# Patient Record
Sex: Female | Born: 1950 | Race: White | Hispanic: No | Marital: Married | Smoking: Never smoker
Health system: Southern US, Community
[De-identification: ages and names within clinical notes are randomized; demographics above are authoritative.]

## PROBLEM LIST (undated history)

## (undated) DIAGNOSIS — E119 Type 2 diabetes mellitus without complications: Secondary | ICD-10-CM

## (undated) DIAGNOSIS — E785 Hyperlipidemia, unspecified: Secondary | ICD-10-CM

## (undated) HISTORY — DX: Type 2 diabetes mellitus without complications: E11.9

## (undated) HISTORY — PX: OTHER SURGICAL HISTORY: SHX169

## (undated) HISTORY — DX: Hyperlipidemia, unspecified: E78.5

---

## 1997-06-05 ENCOUNTER — Other Ambulatory Visit: Admission: RE | Admit: 1997-06-05 | Discharge: 1997-06-05 | Payer: Self-pay | Admitting: Obstetrics and Gynecology

## 2005-03-13 ENCOUNTER — Ambulatory Visit: Payer: Self-pay | Admitting: Pulmonary Disease

## 2006-05-11 ENCOUNTER — Other Ambulatory Visit: Admission: RE | Admit: 2006-05-11 | Discharge: 2006-05-11 | Payer: Self-pay | Admitting: Cardiology

## 2008-01-10 ENCOUNTER — Other Ambulatory Visit: Admission: RE | Admit: 2008-01-10 | Discharge: 2008-01-10 | Payer: Self-pay | Admitting: Internal Medicine

## 2009-10-14 ENCOUNTER — Other Ambulatory Visit: Admission: RE | Admit: 2009-10-14 | Discharge: 2009-10-14 | Payer: Self-pay | Admitting: Internal Medicine

## 2013-09-09 ENCOUNTER — Other Ambulatory Visit (HOSPITAL_COMMUNITY)
Admission: RE | Admit: 2013-09-09 | Discharge: 2013-09-09 | Disposition: A | Discharge status: Home / Self Care | Payer: BC Managed Care – PPO | Source: Ambulatory Visit | Attending: Internal Medicine | Admitting: Internal Medicine

## 2013-09-09 DIAGNOSIS — Z01419 Encounter for gynecological examination (general) (routine) without abnormal findings: Secondary | ICD-10-CM | POA: Insufficient documentation

## 2013-09-30 ENCOUNTER — Encounter: Payer: BC Managed Care – PPO | Attending: Internal Medicine

## 2013-09-30 VITALS — Ht 63.0 in | Wt 194.3 lb

## 2013-09-30 DIAGNOSIS — Z713 Dietary counseling and surveillance: Secondary | ICD-10-CM | POA: Insufficient documentation

## 2013-09-30 DIAGNOSIS — E119 Type 2 diabetes mellitus without complications: Secondary | ICD-10-CM | POA: Insufficient documentation

## 2013-10-01 NOTE — Progress Notes (Signed)
Patient was seen on 09/30/13 for the first of a series of three diabetes self-management courses at the [facility].  Current HbA1c: Not available  The following learning objectives were met by the patient during this class:  Describe diabetes  State some common risk factors for diabetes  Defines the role of glucose and insulin  Identifies type of diabetes and pathophysiology  Describe the relationship between diabetes and cardiovascular risk  State the members of the Healthcare Team  States the rationale for glucose monitoring  State when to test glucose  State their individual Target Range  State the importance of logging glucose readings  Describe how to interpret glucose readings  Identifies A1C target  Explain the correlation between A1c and eAG values  State symptoms and treatment of high blood glucose  State symptoms and treatment of low blood glucose  Explain proper technique for glucose testing  Identifies proper sharps disposal  Handouts given during class include:  Living Well with Diabetes book  Carb Counting and Meal Planning book  Meal Plan Card  Carbohydrate guide  Meal planning worksheet  Low Sodium Flavoring Tips  The diabetes portion plate  P8I to eAG Conversion Chart  Diabetes Medications  Diabetes Recommended Care Schedule  Support Group  Diabetes Success Plan  Core Class Satisfaction Survey  Follow-Up Plan:  Attend core 2

## 2013-10-01 NOTE — Progress Notes (Deleted)
Patient was seen on 09/27/13 for the complete diabetes self-management series at the [facility]. This is a part of the [facility] [employer].  Current A1c = Not available  Handouts given during class include:  Living Well with Diabetes book  Carb Counting and Meal Planning book  Meal Plan Card  Carbohydrate guide  Meal planning worksheet  Low Sodium Flavoring Tips  The diabetes portion plate  Low Carbohydrate Snack Suggestions  A1c to eAG Conversion Chart  Diabetes Medications  Stress Management  Diabetes Recommended Care Schedule  Diabetes Success Plan  Core Class Satisfaction Survey  The following learning objectives were met by the patient during this course:  Describe diabetes  State some common risk factors for diabetes  Defines the role of glucose and insulin  Identifies type of diabetes and pathophysiology  Describe the relationship between diabetes and cardiovascular risk  State the members of the Healthcare Team  States the rationale for glucose monitoring  State when to test glucose  State their individual Target Range  State the importance of logging glucose readings  Describe how to interpret glucose readings  Identifies A1C target  Explain the correlation between A1c and eAG values  State symptoms and treatment of high blood glucose  State symptoms and treatment of low blood glucose  Explain proper technique for glucose testing  Identifies proper sharps disposal  Describe the role of different macronutrients on glucose  Explain how carbohydrates affect blood glucose  State what foods contain the most carbohydrates  Demonstrate carbohydrate counting  Demonstrate how to read Nutrition Facts food label  Describe effects of various fats on heart health  Describe the importance of good nutrition for health and healthy eating strategies  Describe techniques for managing your shopping, cooking and  meal planning  List strategies to follow meal plan when dining out  Describe the effects of alcohol on glucose and how to use it safely   State the amount of activity recommended for healthy living   Describe activities suitable for individual needs   Identify ways to regularly incorporate activity into daily life   Identify barriers to activity and ways to over come these barriers  Identify diabetes medications being personally used and their primary action for lowering glucose and possible side effects   Describe role of stress on blood glucose and develop strategies to address psychosocial issues   Identify diabetes complications and ways to prevent them  Explain how to manage diabetes during illness   Evaluate success in meeting personal goal   Establish 2-3 goals that they will plan to diligently work on until they return for the  50-monthfollow-up visit  Goals:  Follow Diabetes Meal Plan as instructed  Eat 3 meals and 2 snacks, every 3-5 hrs  Limit carbohydrate intake to 45 grams carbohydrate/meal Limit carbohydrate intake to 15 grams carbohydrate/snack Add lean protein foods to meals/snacks  Monitor glucose levels as instructed by your doctor  Aim for 15-30 mins of physical activity daily as tolerated  Bring food record and glucose log to all healthcare visits

## 2013-10-07 DIAGNOSIS — E119 Type 2 diabetes mellitus without complications: Secondary | ICD-10-CM

## 2013-10-07 NOTE — Progress Notes (Signed)

## 2013-10-14 DIAGNOSIS — E119 Type 2 diabetes mellitus without complications: Secondary | ICD-10-CM

## 2013-10-14 NOTE — Progress Notes (Signed)
Patient was seen on 10/14/13 for the third of a series of three diabetes self-management courses at the [facility]. The following learning objectives were met by the patient during this class:    State the amount of activity recommended for healthy living   Describe activities suitable for individual needs   Identify ways to regularly incorporate activity into daily life   Identify barriers to activity and ways to over come these barriers  Identify diabetes medications being personally used and their primary action for lowering glucose and possible side effects   Describe role of stress on blood glucose and develop strategies to address psychosocial issues   Identify diabetes complications and ways to prevent them  Explain how to manage diabetes during illness   Evaluate success in meeting personal goal   Establish 2-3 goals that they will plan to diligently work on until they return for the  48-month follow-up visit  Goals:  Follow Diabetes Meal Plan as instructed  Aim for 15-30 mins of physical activity daily as tolerated  Bring food record and glucose log to your follow up visit  Your patient has established the following 4 month goals in their individualized success plan: I will count my carb choices at most meals and snacks I will increase my activity level at least 5 days a week I will take my diabetes medications as scheduled I will eat less fat filled items like pizza I usually eat something different from my family because of diabetes. I'm going to try to integrate food we can all eat to make it easier and less expensive.  Your patient has identified these potential barriers to change:  finances  Your patient has identified their diabetes self-care support plan as  [facility] Support Group  Magazine subscriptions  Family support  On-Line resources  Plan:  Attend Core 4 in 4 months

## 2014-02-23 ENCOUNTER — Encounter: Payer: BC Managed Care – PPO | Attending: Internal Medicine

## 2014-02-23 DIAGNOSIS — Z713 Dietary counseling and surveillance: Secondary | ICD-10-CM | POA: Insufficient documentation

## 2014-02-23 DIAGNOSIS — E119 Type 2 diabetes mellitus without complications: Secondary | ICD-10-CM | POA: Insufficient documentation

## 2014-02-23 NOTE — Progress Notes (Signed)
Appt start time: 0900 end time:  1000.  Patient was seen on 02/23/14 for a review of the series of three diabetes self-management courses at the [facility]. The following learning objectives were met by the patient during this class:  . Reviewed blood glucose monitoring and interpretation including the recommended target ranges and Hgb A1c.  . Reviewed on carb counting, importance of regularly scheduled meals/snacks, and meal planning.  . Reviewed the effects of physical activity on glucose levels and long-term glucose control.  Recommended goal of 150 minutes of physical activity/week. . Reviewed patient medications and discussed role of medication on blood glucose and possible side effects. . Discussed strategies to manage stress, psychosocial issues, and other obstacles to diabetes management. . Encouraged moderate weight reduction to improve glucose levels.   . Reviewed short-term complications: hyper- and hypo-glycemia.  Discussed causes, symptoms, and treatment options. . Reviewed prevention, detection, and treatment of long-term complications.  Discussed the role of prolonged elevated glucose levels on body systems.  Goals:  Follow Diabetes Meal Plan as instructed  Eat 3 meals and 2 snacks, every 3-5 hrs  Limit carbohydrate intake to 45 grams carbohydrate/meal Limit carbohydrate intake to 15 grams carbohydrate/snack Add lean protein foods to meals/snacks  Monitor glucose levels as instructed by your doctor  Aim for goal of 15-30 mins of physical activity daily as tolerated  Bring food record and glucose log to your next nutrition visit   

## 2014-09-01 ENCOUNTER — Other Ambulatory Visit: Payer: Self-pay

## 2015-02-21 ENCOUNTER — Emergency Department (HOSPITAL_COMMUNITY)
Admission: EM | Admit: 2015-02-21 | Discharge: 2015-02-22 | Disposition: A | Discharge status: Home / Self Care | Payer: No Typology Code available for payment source | Attending: Emergency Medicine | Admitting: Emergency Medicine

## 2015-02-21 ENCOUNTER — Encounter (HOSPITAL_COMMUNITY): Payer: Self-pay

## 2015-02-21 ENCOUNTER — Emergency Department (HOSPITAL_COMMUNITY): Payer: No Typology Code available for payment source

## 2015-02-21 DIAGNOSIS — S1081XA Abrasion of other specified part of neck, initial encounter: Secondary | ICD-10-CM | POA: Insufficient documentation

## 2015-02-21 DIAGNOSIS — S062X9A Diffuse traumatic brain injury with loss of consciousness of unspecified duration, initial encounter: Secondary | ICD-10-CM | POA: Insufficient documentation

## 2015-02-21 DIAGNOSIS — E119 Type 2 diabetes mellitus without complications: Secondary | ICD-10-CM | POA: Diagnosis not present

## 2015-02-21 DIAGNOSIS — S0990XA Unspecified injury of head, initial encounter: Secondary | ICD-10-CM | POA: Diagnosis present

## 2015-02-21 DIAGNOSIS — S3992XA Unspecified injury of lower back, initial encounter: Secondary | ICD-10-CM | POA: Insufficient documentation

## 2015-02-21 DIAGNOSIS — Y9389 Activity, other specified: Secondary | ICD-10-CM | POA: Diagnosis not present

## 2015-02-21 DIAGNOSIS — Y92410 Unspecified street and highway as the place of occurrence of the external cause: Secondary | ICD-10-CM | POA: Diagnosis not present

## 2015-02-21 DIAGNOSIS — E785 Hyperlipidemia, unspecified: Secondary | ICD-10-CM | POA: Diagnosis not present

## 2015-02-21 DIAGNOSIS — Y998 Other external cause status: Secondary | ICD-10-CM | POA: Diagnosis not present

## 2015-02-21 DIAGNOSIS — S4992XA Unspecified injury of left shoulder and upper arm, initial encounter: Secondary | ICD-10-CM | POA: Insufficient documentation

## 2015-02-21 DIAGNOSIS — S06309A Unspecified focal traumatic brain injury with loss of consciousness of unspecified duration, initial encounter: Secondary | ICD-10-CM

## 2015-02-21 DIAGNOSIS — Z79899 Other long term (current) drug therapy: Secondary | ICD-10-CM | POA: Diagnosis not present

## 2015-02-21 MED ORDER — HYDROCODONE-ACETAMINOPHEN 5-325 MG PO TABS
1.0000 | ORAL_TABLET | Freq: Once | ORAL | Status: AC
  Administered 2015-02-22: 1 via ORAL
  Filled 2015-02-21: qty 1

## 2015-02-21 MED ORDER — ONDANSETRON 4 MG PO TBDP
4.0000 mg | ORAL_TABLET | Freq: Once | ORAL | Status: AC
  Administered 2015-02-22: 4 mg via ORAL
  Filled 2015-02-21: qty 1
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
## 2015-02-22 ENCOUNTER — Emergency Department (HOSPITAL_COMMUNITY): Payer: No Typology Code available for payment source

## 2015-02-22 DIAGNOSIS — S062X9A Diffuse traumatic brain injury with loss of consciousness of unspecified duration, initial encounter: Secondary | ICD-10-CM | POA: Diagnosis not present

## 2015-02-22 MED ORDER — OXYCODONE HCL 5 MG PO TABS: 5.0000 mg | ORAL_TABLET | ORAL | Status: AC | PRN

## 2015-02-22 MED ORDER — ACETAMINOPHEN 325 MG PO TABS: 650.0000 mg | ORAL_TABLET | Freq: Four times a day (QID) | ORAL | Status: AC | PRN

## 2015-02-22 MED ORDER — METOCLOPRAMIDE HCL 10 MG PO TABS: 10.0000 mg | ORAL_TABLET | Freq: Three times a day (TID) | ORAL | Status: AC | PRN

## 2015-02-22 MED ORDER — ACETAMINOPHEN 500 MG PO TABS
1000.0000 mg | ORAL_TABLET | Freq: Once | ORAL | Status: AC
  Administered 2015-02-22: 1000 mg via ORAL
  Filled 2015-02-22: qty 2

## 2015-02-22 NOTE — ED Notes (Signed)
Pt ambulatory w/ steady gait to restroom. 

## 2015-02-22 NOTE — ED Provider Notes (Signed)
patient seen and examined, discussed with patient the results and the plan, discussed with neurosurgery, they agree with follow-up in one week, the patient has a normal mental status, headache is persistent, mild to moderate, she is aware of the need for close follow-up and also the red flags including focal neurologic symptoms that would mandate return. Stable for discharge at this time.  Noemi Chapel, MD 02/22/15 [phone]

## 2015-02-22 NOTE — Discharge Instructions (Signed)

## 2015-02-22 NOTE — ED Notes (Signed)
Pt verbalizes understanding of discharge and follow up instructions. NAD on departure. No neuro deficits noted. NIH 0. A/O x4. Ambulatory with steady gait. Wheeled to waiting room to be transported home by husband.

## 2015-02-22 NOTE — ED Notes (Signed)
Food given 

## 2015-02-22 NOTE — ED Notes (Signed)
The pt passed her swallow screen

## 2015-02-22 NOTE — ED Notes (Signed)
The pt is c/o a headache she is alert and oriented skin warm and dry .  Moves all extremities  Equal grips

## 2015-09-15 ENCOUNTER — Other Ambulatory Visit (HOSPITAL_COMMUNITY)
Admission: RE | Admit: 2015-09-15 | Discharge: 2015-09-15 | Disposition: A | Discharge status: Home / Self Care | Payer: BLUE CROSS/BLUE SHIELD | Source: Ambulatory Visit | Attending: Internal Medicine | Admitting: Internal Medicine

## 2015-09-15 DIAGNOSIS — Z01419 Encounter for gynecological examination (general) (routine) without abnormal findings: Secondary | ICD-10-CM | POA: Diagnosis not present

## 2015-09-17 ENCOUNTER — Other Ambulatory Visit: Payer: Self-pay | Admitting: Registered Nurse

## 2015-09-22 LAB — CYTOLOGY - PAP

## 2017-03-05 DIAGNOSIS — E782 Mixed hyperlipidemia: Secondary | ICD-10-CM | POA: Diagnosis not present

## 2017-03-05 DIAGNOSIS — E119 Type 2 diabetes mellitus without complications: Secondary | ICD-10-CM | POA: Diagnosis not present

## 2017-03-06 DIAGNOSIS — Z0000 Encounter for general adult medical examination without abnormal findings: Secondary | ICD-10-CM | POA: Diagnosis not present

## 2017-03-06 DIAGNOSIS — Z23 Encounter for immunization: Secondary | ICD-10-CM | POA: Diagnosis not present

## 2017-03-12 DIAGNOSIS — M542 Cervicalgia: Secondary | ICD-10-CM | POA: Diagnosis not present

## 2017-03-12 DIAGNOSIS — Z23 Encounter for immunization: Secondary | ICD-10-CM | POA: Diagnosis not present

## 2017-03-12 DIAGNOSIS — E782 Mixed hyperlipidemia: Secondary | ICD-10-CM | POA: Diagnosis not present

## 2017-03-12 DIAGNOSIS — I8312 Varicose veins of left lower extremity with inflammation: Secondary | ICD-10-CM | POA: Diagnosis not present

## 2017-03-12 DIAGNOSIS — E119 Type 2 diabetes mellitus without complications: Secondary | ICD-10-CM | POA: Diagnosis not present

## 2017-03-12 DIAGNOSIS — Z780 Asymptomatic menopausal state: Secondary | ICD-10-CM | POA: Diagnosis not present

## 2017-05-16 IMAGING — CT CT HEAD W/O CM
2 series · 15 of 30 positions shown, 19 images · non-contrast
Comparison: 02/21/2015

CLINICAL DATA: Follow-up intraparenchymal hemorrhage

EXAM:
CT HEAD WITHOUT CONTRAST
TECHNIQUE: Contiguous axial images were obtained from the base of the skull
through the vertex without intravenous contrast.

[Series 201: head w/o, idose (1) · axial · non-contrast · 0.43mm/px · z∈[+51,+171]mm · 13 of 29 slices shown, 17 images]
[im 3/29  brain]
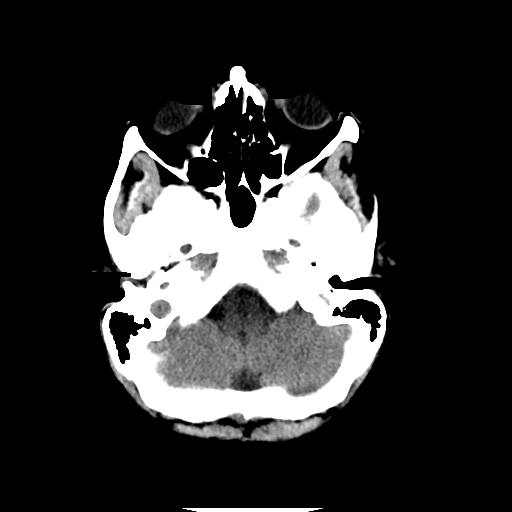
[im 3/29  bone]
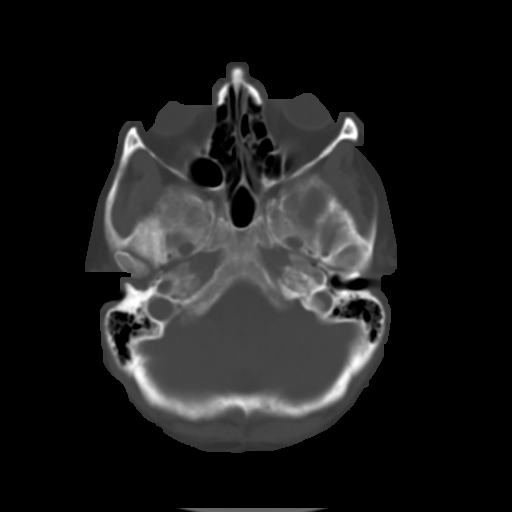
[im 5/29  brain]
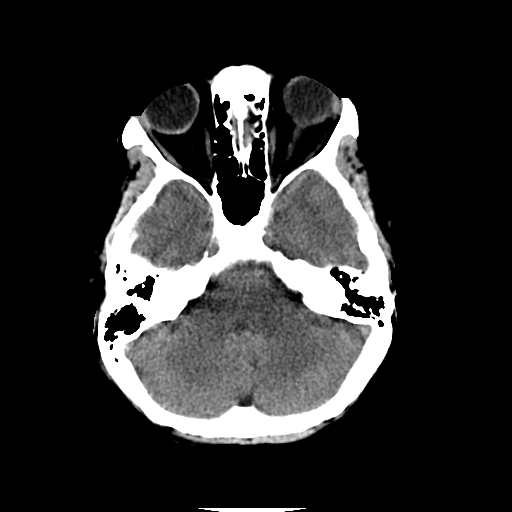
[im 7/29  brain]
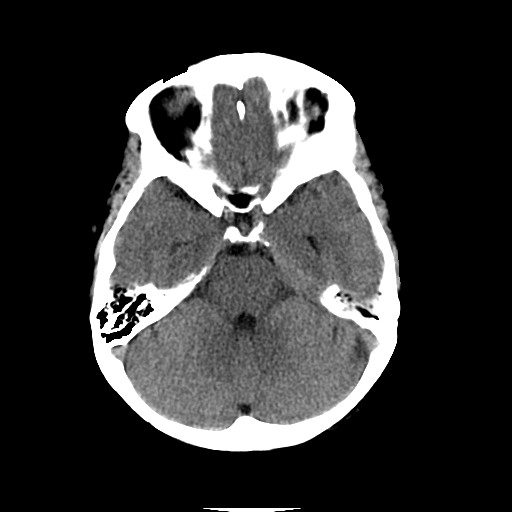
[im 9/29  brain]
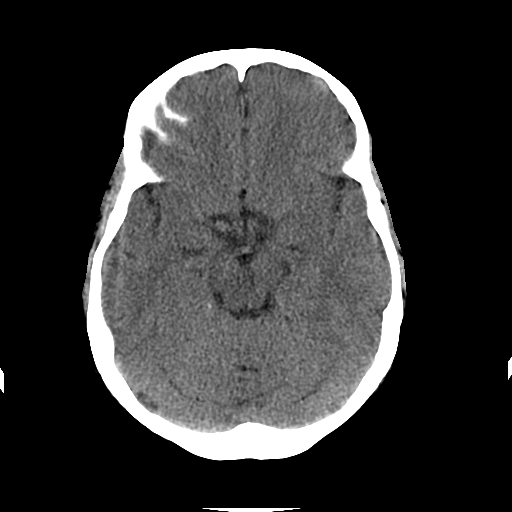
[im 11/29  brain]
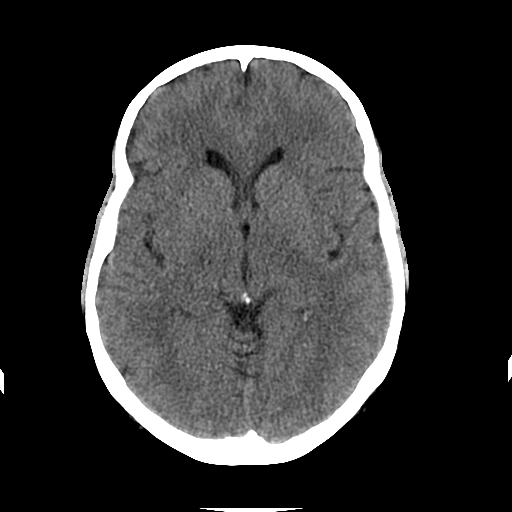
[im 11/29  bone]
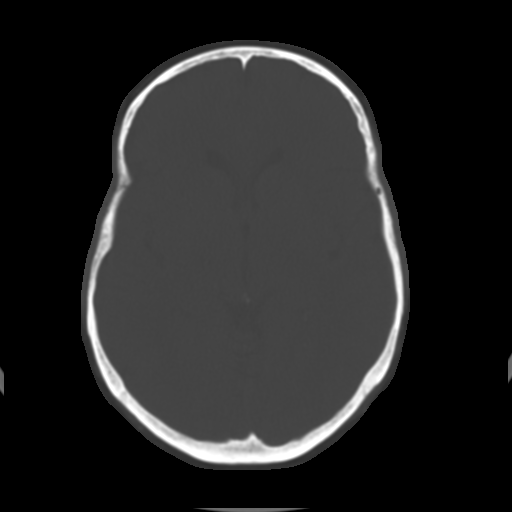
[im 13/29  brain]
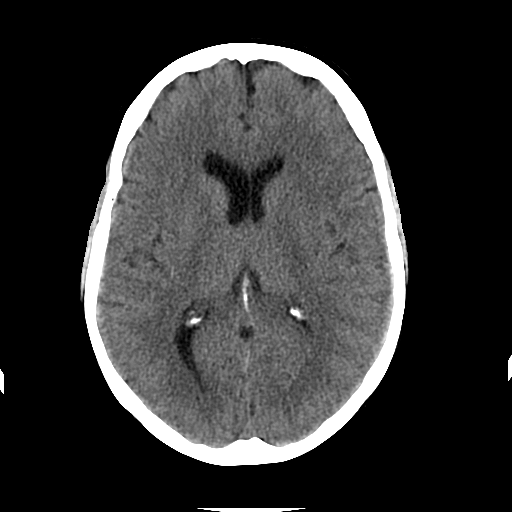
[im 15/29  brain]
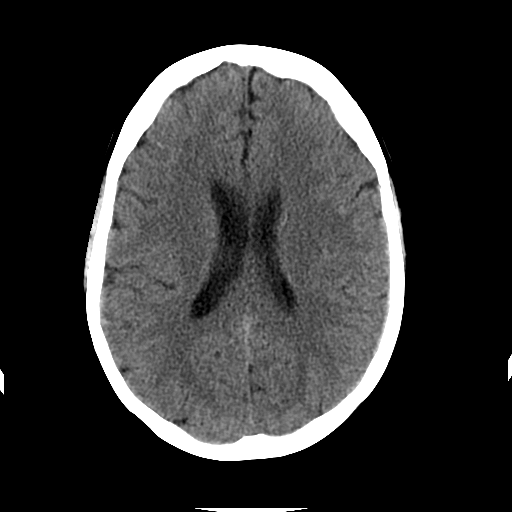
[im 17/29  brain]
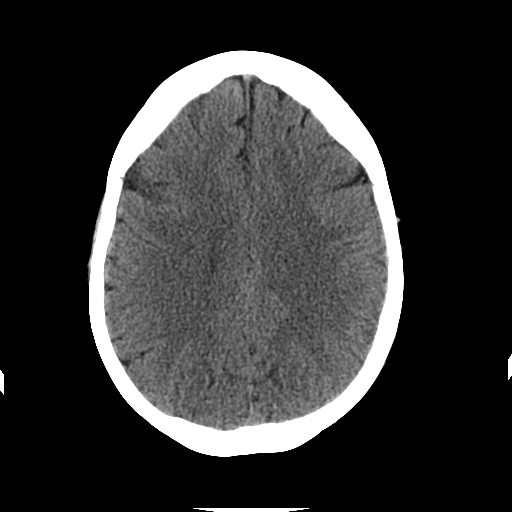
[im 19/29  brain]
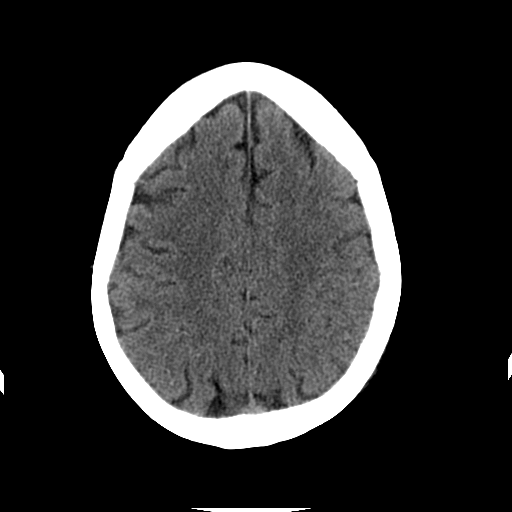
[im 19/29  bone]
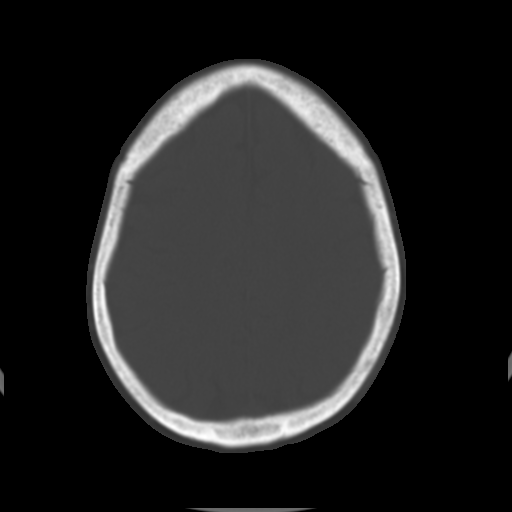
[im 21/29  brain]
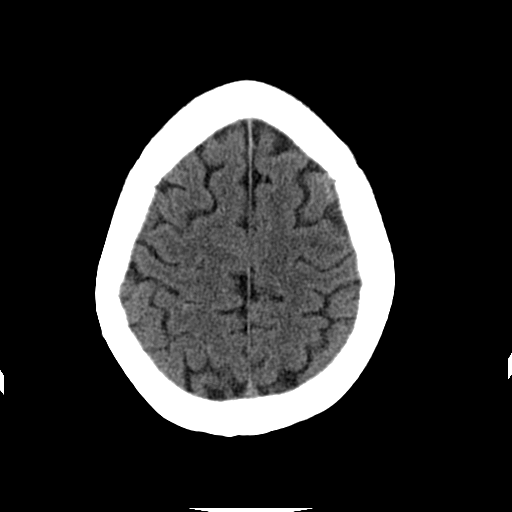
[im 23/29  brain]
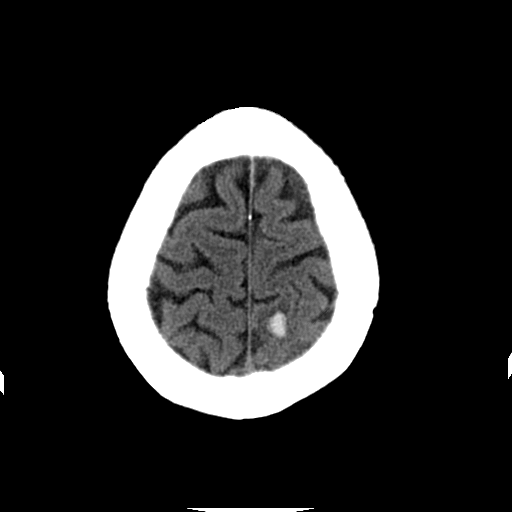
[im 25/29  brain]
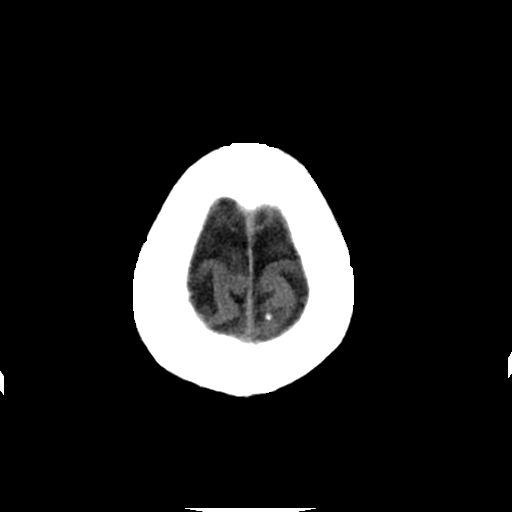
[im 27/29  brain]
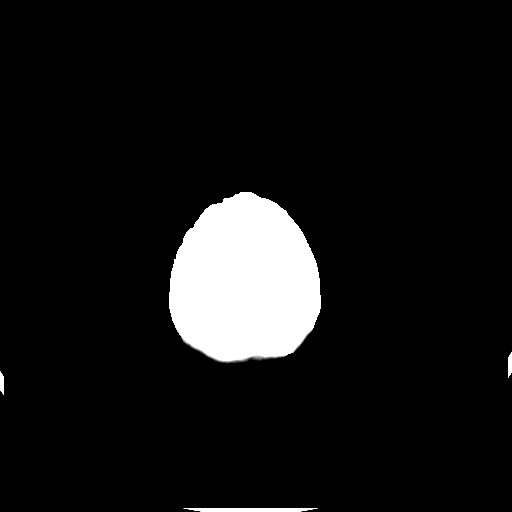
[im 27/29  bone]
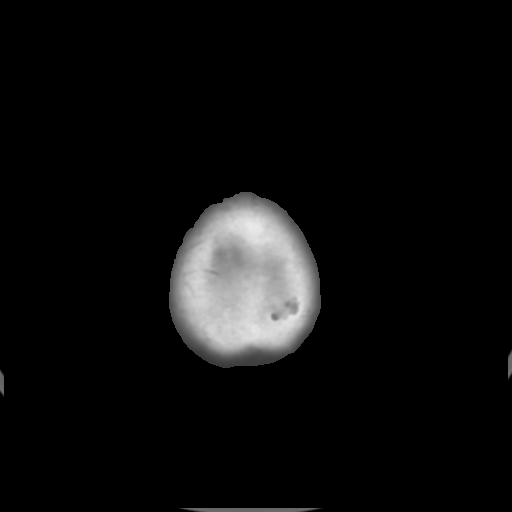

[Series 202: head w/o bone, idose (1) · axial · non-contrast · 0.43mm/px · z∈[+51,+71]mm · 2 of 29 slices shown]
[im 3/29  bone]
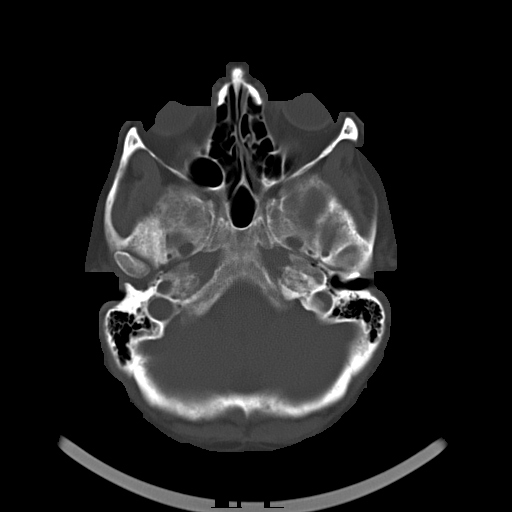
[im 7/29  bone]
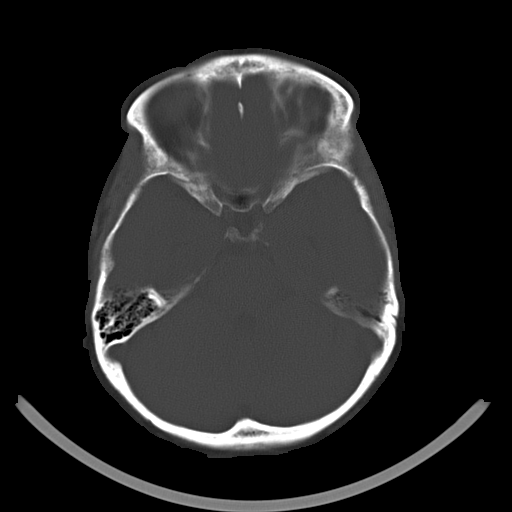

[15 of 30 positions shown; findings below may reference images not displayed]

FINDINGS: There is a stable 11 mm ovoid hyperdense focus in the left parietal
lobe consistent with intraparenchymal hemorrhage with mild
surrounding vasogenic edema.

There is no evidence of other intraparenchymal hemorrhage.

There is no evidence of mass effect, midline shift or extra-axial
fluid collections. There is no evidence of a space-occupying lesion.
There is no evidence of a cortical-based area of acute infarction.

The ventricles and sulci are appropriate for the patient's age. The
basal cisterns are patent.

Visualized portions of the orbits are unremarkable. The visualized
portions of the paranasal sinuses and mastoid air cells are
unremarkable.

The osseous structures are unremarkable.
IMPRESSION: Stable 11 mm left parietal lobe intraparenchymal hemorrhage without
significant interval change compared with 02/21/2015. No new areas
of hemorrhage.

## 2017-05-23 DIAGNOSIS — D485 Neoplasm of uncertain behavior of skin: Secondary | ICD-10-CM | POA: Diagnosis not present

## 2017-05-23 DIAGNOSIS — L814 Other melanin hyperpigmentation: Secondary | ICD-10-CM | POA: Diagnosis not present

## 2017-05-23 DIAGNOSIS — L821 Other seborrheic keratosis: Secondary | ICD-10-CM | POA: Diagnosis not present

## 2017-05-23 DIAGNOSIS — C44519 Basal cell carcinoma of skin of other part of trunk: Secondary | ICD-10-CM | POA: Diagnosis not present

## 2017-05-23 DIAGNOSIS — D225 Melanocytic nevi of trunk: Secondary | ICD-10-CM | POA: Diagnosis not present

## 2017-05-23 DIAGNOSIS — Z85828 Personal history of other malignant neoplasm of skin: Secondary | ICD-10-CM | POA: Diagnosis not present

## 2017-05-23 DIAGNOSIS — L570 Actinic keratosis: Secondary | ICD-10-CM | POA: Diagnosis not present

## 2017-05-23 DIAGNOSIS — Z23 Encounter for immunization: Secondary | ICD-10-CM | POA: Diagnosis not present

## 2017-05-23 DIAGNOSIS — D1801 Hemangioma of skin and subcutaneous tissue: Secondary | ICD-10-CM | POA: Diagnosis not present

## 2017-05-25 DIAGNOSIS — C44519 Basal cell carcinoma of skin of other part of trunk: Secondary | ICD-10-CM | POA: Diagnosis not present

## 2017-05-25 DIAGNOSIS — D485 Neoplasm of uncertain behavior of skin: Secondary | ICD-10-CM | POA: Diagnosis not present

## 2017-08-07 DIAGNOSIS — E782 Mixed hyperlipidemia: Secondary | ICD-10-CM | POA: Diagnosis not present

## 2017-08-07 DIAGNOSIS — E1165 Type 2 diabetes mellitus with hyperglycemia: Secondary | ICD-10-CM | POA: Diagnosis not present

## 2017-08-13 DIAGNOSIS — B49 Unspecified mycosis: Secondary | ICD-10-CM | POA: Diagnosis not present

## 2017-08-13 DIAGNOSIS — I10 Essential (primary) hypertension: Secondary | ICD-10-CM | POA: Diagnosis not present

## 2017-08-13 DIAGNOSIS — E119 Type 2 diabetes mellitus without complications: Secondary | ICD-10-CM | POA: Diagnosis not present

## 2017-08-13 DIAGNOSIS — R000 Tachycardia, unspecified: Secondary | ICD-10-CM | POA: Diagnosis not present

## 2017-10-01 DIAGNOSIS — E119 Type 2 diabetes mellitus without complications: Secondary | ICD-10-CM | POA: Diagnosis not present

## 2017-10-01 DIAGNOSIS — E782 Mixed hyperlipidemia: Secondary | ICD-10-CM | POA: Diagnosis not present

## 2017-10-01 DIAGNOSIS — R000 Tachycardia, unspecified: Secondary | ICD-10-CM | POA: Diagnosis not present

## 2017-10-01 DIAGNOSIS — B49 Unspecified mycosis: Secondary | ICD-10-CM | POA: Diagnosis not present

## 2017-10-01 DIAGNOSIS — I10 Essential (primary) hypertension: Secondary | ICD-10-CM | POA: Diagnosis not present

## 2018-01-22 DIAGNOSIS — Z1231 Encounter for screening mammogram for malignant neoplasm of breast: Secondary | ICD-10-CM | POA: Diagnosis not present

## 2018-03-13 DIAGNOSIS — E119 Type 2 diabetes mellitus without complications: Secondary | ICD-10-CM | POA: Diagnosis not present

## 2018-03-13 DIAGNOSIS — Z0000 Encounter for general adult medical examination without abnormal findings: Secondary | ICD-10-CM | POA: Diagnosis not present

## 2018-03-13 DIAGNOSIS — E782 Mixed hyperlipidemia: Secondary | ICD-10-CM | POA: Diagnosis not present

## 2018-03-13 DIAGNOSIS — Z23 Encounter for immunization: Secondary | ICD-10-CM | POA: Diagnosis not present

## 2018-03-20 DIAGNOSIS — M542 Cervicalgia: Secondary | ICD-10-CM | POA: Diagnosis not present

## 2018-03-20 DIAGNOSIS — E119 Type 2 diabetes mellitus without complications: Secondary | ICD-10-CM | POA: Diagnosis not present

## 2018-03-20 DIAGNOSIS — B49 Unspecified mycosis: Secondary | ICD-10-CM | POA: Diagnosis not present

## 2018-03-20 DIAGNOSIS — E782 Mixed hyperlipidemia: Secondary | ICD-10-CM | POA: Diagnosis not present

## 2018-03-20 DIAGNOSIS — I10 Essential (primary) hypertension: Secondary | ICD-10-CM | POA: Diagnosis not present

## 2018-05-28 DIAGNOSIS — C44519 Basal cell carcinoma of skin of other part of trunk: Secondary | ICD-10-CM | POA: Diagnosis not present

## 2018-05-28 DIAGNOSIS — D225 Melanocytic nevi of trunk: Secondary | ICD-10-CM | POA: Diagnosis not present

## 2018-05-28 DIAGNOSIS — Z23 Encounter for immunization: Secondary | ICD-10-CM | POA: Diagnosis not present

## 2018-05-28 DIAGNOSIS — L814 Other melanin hyperpigmentation: Secondary | ICD-10-CM | POA: Diagnosis not present

## 2018-05-28 DIAGNOSIS — L570 Actinic keratosis: Secondary | ICD-10-CM | POA: Diagnosis not present

## 2018-05-28 DIAGNOSIS — D485 Neoplasm of uncertain behavior of skin: Secondary | ICD-10-CM | POA: Diagnosis not present

## 2018-05-28 DIAGNOSIS — Z85828 Personal history of other malignant neoplasm of skin: Secondary | ICD-10-CM | POA: Diagnosis not present

## 2018-05-28 DIAGNOSIS — L820 Inflamed seborrheic keratosis: Secondary | ICD-10-CM | POA: Diagnosis not present

## 2018-05-28 DIAGNOSIS — D0461 Carcinoma in situ of skin of right upper limb, including shoulder: Secondary | ICD-10-CM | POA: Diagnosis not present

## 2018-05-28 DIAGNOSIS — L304 Erythema intertrigo: Secondary | ICD-10-CM | POA: Diagnosis not present

## 2018-05-28 DIAGNOSIS — L821 Other seborrheic keratosis: Secondary | ICD-10-CM | POA: Diagnosis not present

## 2018-06-24 DIAGNOSIS — D0461 Carcinoma in situ of skin of right upper limb, including shoulder: Secondary | ICD-10-CM | POA: Diagnosis not present

## 2018-10-09 DIAGNOSIS — E119 Type 2 diabetes mellitus without complications: Secondary | ICD-10-CM | POA: Diagnosis not present

## 2018-10-09 DIAGNOSIS — I10 Essential (primary) hypertension: Secondary | ICD-10-CM | POA: Diagnosis not present

## 2018-10-09 DIAGNOSIS — E782 Mixed hyperlipidemia: Secondary | ICD-10-CM | POA: Diagnosis not present

## 2018-10-22 DIAGNOSIS — M25519 Pain in unspecified shoulder: Secondary | ICD-10-CM | POA: Diagnosis not present

## 2018-10-22 DIAGNOSIS — Z0000 Encounter for general adult medical examination without abnormal findings: Secondary | ICD-10-CM | POA: Diagnosis not present

## 2018-10-22 DIAGNOSIS — I10 Essential (primary) hypertension: Secondary | ICD-10-CM | POA: Diagnosis not present

## 2018-10-22 DIAGNOSIS — E782 Mixed hyperlipidemia: Secondary | ICD-10-CM | POA: Diagnosis not present

## 2018-10-22 DIAGNOSIS — E119 Type 2 diabetes mellitus without complications: Secondary | ICD-10-CM | POA: Diagnosis not present

## 2018-10-22 DIAGNOSIS — Z7189 Other specified counseling: Secondary | ICD-10-CM | POA: Diagnosis not present

## 2018-11-05 DIAGNOSIS — M064 Inflammatory polyarthropathy: Secondary | ICD-10-CM | POA: Diagnosis not present

## 2018-11-05 DIAGNOSIS — M353 Polymyalgia rheumatica: Secondary | ICD-10-CM | POA: Diagnosis not present

## 2018-11-05 DIAGNOSIS — M2550 Pain in unspecified joint: Secondary | ICD-10-CM | POA: Diagnosis not present

## 2018-11-05 DIAGNOSIS — M25519 Pain in unspecified shoulder: Secondary | ICD-10-CM | POA: Diagnosis not present

## 2018-11-05 DIAGNOSIS — M1990 Unspecified osteoarthritis, unspecified site: Secondary | ICD-10-CM | POA: Diagnosis not present

## 2018-11-18 DIAGNOSIS — M25519 Pain in unspecified shoulder: Secondary | ICD-10-CM | POA: Diagnosis not present

## 2018-11-18 DIAGNOSIS — I10 Essential (primary) hypertension: Secondary | ICD-10-CM | POA: Diagnosis not present

## 2018-11-18 DIAGNOSIS — E119 Type 2 diabetes mellitus without complications: Secondary | ICD-10-CM | POA: Diagnosis not present

## 2018-11-18 DIAGNOSIS — M25569 Pain in unspecified knee: Secondary | ICD-10-CM | POA: Diagnosis not present

## 2018-11-18 DIAGNOSIS — M2550 Pain in unspecified joint: Secondary | ICD-10-CM | POA: Diagnosis not present

## 2018-11-18 DIAGNOSIS — M064 Inflammatory polyarthropathy: Secondary | ICD-10-CM | POA: Diagnosis not present

## 2018-11-18 DIAGNOSIS — M1990 Unspecified osteoarthritis, unspecified site: Secondary | ICD-10-CM | POA: Diagnosis not present

## 2018-11-18 DIAGNOSIS — M25559 Pain in unspecified hip: Secondary | ICD-10-CM | POA: Diagnosis not present

## 2018-11-18 DIAGNOSIS — M353 Polymyalgia rheumatica: Secondary | ICD-10-CM | POA: Diagnosis not present

## 2019-02-04 DIAGNOSIS — Z803 Family history of malignant neoplasm of breast: Secondary | ICD-10-CM | POA: Diagnosis not present

## 2019-02-04 DIAGNOSIS — Z1231 Encounter for screening mammogram for malignant neoplasm of breast: Secondary | ICD-10-CM | POA: Diagnosis not present

## 2019-03-05 DIAGNOSIS — M25569 Pain in unspecified knee: Secondary | ICD-10-CM | POA: Diagnosis not present

## 2019-03-05 DIAGNOSIS — E119 Type 2 diabetes mellitus without complications: Secondary | ICD-10-CM | POA: Diagnosis not present

## 2019-03-05 DIAGNOSIS — M353 Polymyalgia rheumatica: Secondary | ICD-10-CM | POA: Diagnosis not present

## 2019-03-05 DIAGNOSIS — M2550 Pain in unspecified joint: Secondary | ICD-10-CM | POA: Diagnosis not present

## 2019-03-05 DIAGNOSIS — M1990 Unspecified osteoarthritis, unspecified site: Secondary | ICD-10-CM | POA: Diagnosis not present

## 2019-03-05 DIAGNOSIS — I10 Essential (primary) hypertension: Secondary | ICD-10-CM | POA: Diagnosis not present

## 2019-03-05 DIAGNOSIS — M25519 Pain in unspecified shoulder: Secondary | ICD-10-CM | POA: Diagnosis not present

## 2019-03-19 DIAGNOSIS — E119 Type 2 diabetes mellitus without complications: Secondary | ICD-10-CM | POA: Diagnosis not present

## 2019-03-19 DIAGNOSIS — Z0000 Encounter for general adult medical examination without abnormal findings: Secondary | ICD-10-CM | POA: Diagnosis not present

## 2019-03-19 DIAGNOSIS — I10 Essential (primary) hypertension: Secondary | ICD-10-CM | POA: Diagnosis not present

## 2019-03-19 DIAGNOSIS — Z780 Asymptomatic menopausal state: Secondary | ICD-10-CM | POA: Diagnosis not present

## 2019-03-26 DIAGNOSIS — I8312 Varicose veins of left lower extremity with inflammation: Secondary | ICD-10-CM | POA: Diagnosis not present

## 2019-03-26 DIAGNOSIS — E1169 Type 2 diabetes mellitus with other specified complication: Secondary | ICD-10-CM | POA: Diagnosis not present

## 2019-03-26 DIAGNOSIS — E782 Mixed hyperlipidemia: Secondary | ICD-10-CM | POA: Diagnosis not present

## 2019-03-26 DIAGNOSIS — E119 Type 2 diabetes mellitus without complications: Secondary | ICD-10-CM | POA: Diagnosis not present

## 2019-03-26 DIAGNOSIS — Z23 Encounter for immunization: Secondary | ICD-10-CM | POA: Diagnosis not present

## 2019-03-26 DIAGNOSIS — I10 Essential (primary) hypertension: Secondary | ICD-10-CM | POA: Diagnosis not present

## 2019-03-26 DIAGNOSIS — Z0000 Encounter for general adult medical examination without abnormal findings: Secondary | ICD-10-CM | POA: Diagnosis not present

## 2019-03-26 DIAGNOSIS — R000 Tachycardia, unspecified: Secondary | ICD-10-CM | POA: Diagnosis not present

## 2019-03-26 DIAGNOSIS — M1990 Unspecified osteoarthritis, unspecified site: Secondary | ICD-10-CM | POA: Diagnosis not present

## 2019-09-16 DIAGNOSIS — I8312 Varicose veins of left lower extremity with inflammation: Secondary | ICD-10-CM | POA: Diagnosis not present

## 2019-09-16 DIAGNOSIS — R000 Tachycardia, unspecified: Secondary | ICD-10-CM | POA: Diagnosis not present

## 2019-09-16 DIAGNOSIS — E119 Type 2 diabetes mellitus without complications: Secondary | ICD-10-CM | POA: Diagnosis not present

## 2019-09-16 DIAGNOSIS — I10 Essential (primary) hypertension: Secondary | ICD-10-CM | POA: Diagnosis not present

## 2019-09-16 DIAGNOSIS — M1990 Unspecified osteoarthritis, unspecified site: Secondary | ICD-10-CM | POA: Diagnosis not present

## 2019-09-16 DIAGNOSIS — E1169 Type 2 diabetes mellitus with other specified complication: Secondary | ICD-10-CM | POA: Diagnosis not present

## 2019-09-24 DIAGNOSIS — I10 Essential (primary) hypertension: Secondary | ICD-10-CM | POA: Diagnosis not present

## 2019-09-24 DIAGNOSIS — E119 Type 2 diabetes mellitus without complications: Secondary | ICD-10-CM | POA: Diagnosis not present

## 2019-09-24 DIAGNOSIS — E1169 Type 2 diabetes mellitus with other specified complication: Secondary | ICD-10-CM | POA: Diagnosis not present

## 2019-09-24 DIAGNOSIS — R000 Tachycardia, unspecified: Secondary | ICD-10-CM | POA: Diagnosis not present

## 2019-09-24 DIAGNOSIS — E782 Mixed hyperlipidemia: Secondary | ICD-10-CM | POA: Diagnosis not present

## 2019-09-24 DIAGNOSIS — I8312 Varicose veins of left lower extremity with inflammation: Secondary | ICD-10-CM | POA: Diagnosis not present

## 2020-02-20 DIAGNOSIS — Z1231 Encounter for screening mammogram for malignant neoplasm of breast: Secondary | ICD-10-CM | POA: Diagnosis not present

## 2020-05-03 DIAGNOSIS — E782 Mixed hyperlipidemia: Secondary | ICD-10-CM | POA: Diagnosis not present

## 2020-05-03 DIAGNOSIS — I10 Essential (primary) hypertension: Secondary | ICD-10-CM | POA: Diagnosis not present

## 2020-05-03 DIAGNOSIS — E1169 Type 2 diabetes mellitus with other specified complication: Secondary | ICD-10-CM | POA: Diagnosis not present

## 2020-06-29 DIAGNOSIS — E1169 Type 2 diabetes mellitus with other specified complication: Secondary | ICD-10-CM | POA: Diagnosis not present

## 2020-06-29 DIAGNOSIS — E782 Mixed hyperlipidemia: Secondary | ICD-10-CM | POA: Diagnosis not present

## 2020-06-29 DIAGNOSIS — R000 Tachycardia, unspecified: Secondary | ICD-10-CM | POA: Diagnosis not present

## 2020-06-29 DIAGNOSIS — I8312 Varicose veins of left lower extremity with inflammation: Secondary | ICD-10-CM | POA: Diagnosis not present

## 2020-06-29 DIAGNOSIS — I10 Essential (primary) hypertension: Secondary | ICD-10-CM | POA: Diagnosis not present

## 2020-06-29 DIAGNOSIS — J301 Allergic rhinitis due to pollen: Secondary | ICD-10-CM | POA: Diagnosis not present

## 2020-06-29 DIAGNOSIS — K2100 Gastro-esophageal reflux disease with esophagitis, without bleeding: Secondary | ICD-10-CM | POA: Diagnosis not present

## 2020-06-29 DIAGNOSIS — Z0000 Encounter for general adult medical examination without abnormal findings: Secondary | ICD-10-CM | POA: Diagnosis not present

## 2021-03-02 DIAGNOSIS — Z1231 Encounter for screening mammogram for malignant neoplasm of breast: Secondary | ICD-10-CM | POA: Diagnosis not present

## 2021-03-21 DIAGNOSIS — E119 Type 2 diabetes mellitus without complications: Secondary | ICD-10-CM | POA: Diagnosis not present

## 2021-03-21 DIAGNOSIS — E782 Mixed hyperlipidemia: Secondary | ICD-10-CM | POA: Diagnosis not present

## 2021-03-23 DIAGNOSIS — E1169 Type 2 diabetes mellitus with other specified complication: Secondary | ICD-10-CM | POA: Diagnosis not present

## 2021-03-23 DIAGNOSIS — L719 Rosacea, unspecified: Secondary | ICD-10-CM | POA: Diagnosis not present

## 2021-03-23 DIAGNOSIS — I10 Essential (primary) hypertension: Secondary | ICD-10-CM | POA: Diagnosis not present

## 2021-03-23 DIAGNOSIS — E782 Mixed hyperlipidemia: Secondary | ICD-10-CM | POA: Diagnosis not present

## 2021-08-11 ENCOUNTER — Other Ambulatory Visit: Payer: Self-pay | Admitting: Internal Medicine

## 2021-08-11 DIAGNOSIS — I10 Essential (primary) hypertension: Secondary | ICD-10-CM | POA: Diagnosis not present

## 2021-08-11 DIAGNOSIS — R600 Localized edema: Secondary | ICD-10-CM

## 2021-08-11 DIAGNOSIS — R0602 Shortness of breath: Secondary | ICD-10-CM

## 2021-08-11 DIAGNOSIS — R5383 Other fatigue: Secondary | ICD-10-CM | POA: Diagnosis not present

## 2021-08-11 DIAGNOSIS — E1169 Type 2 diabetes mellitus with other specified complication: Secondary | ICD-10-CM | POA: Diagnosis not present

## 2021-08-11 DIAGNOSIS — E782 Mixed hyperlipidemia: Secondary | ICD-10-CM | POA: Diagnosis not present

## 2021-08-12 ENCOUNTER — Ambulatory Visit: Payer: Medicare HMO

## 2021-08-12 DIAGNOSIS — R600 Localized edema: Secondary | ICD-10-CM

## 2021-08-12 DIAGNOSIS — R0602 Shortness of breath: Secondary | ICD-10-CM

## 2021-09-01 DIAGNOSIS — K2100 Gastro-esophageal reflux disease with esophagitis, without bleeding: Secondary | ICD-10-CM | POA: Diagnosis not present

## 2021-09-01 DIAGNOSIS — I10 Essential (primary) hypertension: Secondary | ICD-10-CM | POA: Diagnosis not present

## 2021-09-01 DIAGNOSIS — E782 Mixed hyperlipidemia: Secondary | ICD-10-CM | POA: Diagnosis not present

## 2021-09-01 DIAGNOSIS — I872 Venous insufficiency (chronic) (peripheral): Secondary | ICD-10-CM | POA: Diagnosis not present

## 2021-09-01 DIAGNOSIS — R600 Localized edema: Secondary | ICD-10-CM | POA: Diagnosis not present

## 2021-09-01 DIAGNOSIS — Z0000 Encounter for general adult medical examination without abnormal findings: Secondary | ICD-10-CM | POA: Diagnosis not present

## 2021-09-01 DIAGNOSIS — J301 Allergic rhinitis due to pollen: Secondary | ICD-10-CM | POA: Diagnosis not present

## 2021-09-01 DIAGNOSIS — M25511 Pain in right shoulder: Secondary | ICD-10-CM | POA: Diagnosis not present

## 2021-09-01 DIAGNOSIS — E1169 Type 2 diabetes mellitus with other specified complication: Secondary | ICD-10-CM | POA: Diagnosis not present

## 2021-10-03 DIAGNOSIS — M25512 Pain in left shoulder: Secondary | ICD-10-CM | POA: Diagnosis not present

## 2021-10-03 DIAGNOSIS — M47812 Spondylosis without myelopathy or radiculopathy, cervical region: Secondary | ICD-10-CM | POA: Diagnosis not present

## 2021-10-03 DIAGNOSIS — M25511 Pain in right shoulder: Secondary | ICD-10-CM | POA: Diagnosis not present

## 2021-10-31 DIAGNOSIS — M25512 Pain in left shoulder: Secondary | ICD-10-CM | POA: Diagnosis not present

## 2021-10-31 DIAGNOSIS — M25511 Pain in right shoulder: Secondary | ICD-10-CM | POA: Diagnosis not present

## 2022-03-01 DIAGNOSIS — E1169 Type 2 diabetes mellitus with other specified complication: Secondary | ICD-10-CM | POA: Diagnosis not present

## 2022-03-01 DIAGNOSIS — E782 Mixed hyperlipidemia: Secondary | ICD-10-CM | POA: Diagnosis not present

## 2022-03-20 DIAGNOSIS — Z1231 Encounter for screening mammogram for malignant neoplasm of breast: Secondary | ICD-10-CM | POA: Diagnosis not present

## 2022-03-20 DIAGNOSIS — R600 Localized edema: Secondary | ICD-10-CM | POA: Diagnosis not present

## 2022-03-20 DIAGNOSIS — I10 Essential (primary) hypertension: Secondary | ICD-10-CM | POA: Diagnosis not present

## 2022-03-20 DIAGNOSIS — E782 Mixed hyperlipidemia: Secondary | ICD-10-CM | POA: Diagnosis not present

## 2022-03-20 DIAGNOSIS — K2100 Gastro-esophageal reflux disease with esophagitis, without bleeding: Secondary | ICD-10-CM | POA: Diagnosis not present

## 2022-03-20 DIAGNOSIS — E1169 Type 2 diabetes mellitus with other specified complication: Secondary | ICD-10-CM | POA: Diagnosis not present

## 2022-03-20 DIAGNOSIS — R052 Subacute cough: Secondary | ICD-10-CM | POA: Diagnosis not present

## 2022-03-20 DIAGNOSIS — I872 Venous insufficiency (chronic) (peripheral): Secondary | ICD-10-CM | POA: Diagnosis not present

## 2022-03-20 DIAGNOSIS — J301 Allergic rhinitis due to pollen: Secondary | ICD-10-CM | POA: Diagnosis not present

## 2022-09-04 ENCOUNTER — Ambulatory Visit (INDEPENDENT_AMBULATORY_CARE_PROVIDER_SITE_OTHER): Payer: Medicare HMO | Admitting: Podiatry

## 2022-09-04 DIAGNOSIS — E1169 Type 2 diabetes mellitus with other specified complication: Secondary | ICD-10-CM | POA: Diagnosis not present

## 2022-09-04 DIAGNOSIS — E782 Mixed hyperlipidemia: Secondary | ICD-10-CM | POA: Diagnosis not present

## 2022-09-04 DIAGNOSIS — R5383 Other fatigue: Secondary | ICD-10-CM | POA: Diagnosis not present

## 2022-09-04 DIAGNOSIS — Z91199 Patient's noncompliance with other medical treatment and regimen due to unspecified reason: Secondary | ICD-10-CM

## 2022-09-04 DIAGNOSIS — Z0000 Encounter for general adult medical examination without abnormal findings: Secondary | ICD-10-CM | POA: Diagnosis not present

## 2022-09-04 DIAGNOSIS — I10 Essential (primary) hypertension: Secondary | ICD-10-CM | POA: Diagnosis not present

## 2022-09-04 NOTE — Progress Notes (Signed)
No show

## 2022-09-11 DIAGNOSIS — E1169 Type 2 diabetes mellitus with other specified complication: Secondary | ICD-10-CM | POA: Diagnosis not present

## 2022-09-11 DIAGNOSIS — Z23 Encounter for immunization: Secondary | ICD-10-CM | POA: Diagnosis not present

## 2022-09-11 DIAGNOSIS — K2100 Gastro-esophageal reflux disease with esophagitis, without bleeding: Secondary | ICD-10-CM | POA: Diagnosis not present

## 2022-09-11 DIAGNOSIS — Z0000 Encounter for general adult medical examination without abnormal findings: Secondary | ICD-10-CM | POA: Diagnosis not present

## 2022-09-11 DIAGNOSIS — E782 Mixed hyperlipidemia: Secondary | ICD-10-CM | POA: Diagnosis not present

## 2022-09-11 DIAGNOSIS — I872 Venous insufficiency (chronic) (peripheral): Secondary | ICD-10-CM | POA: Diagnosis not present

## 2022-09-11 DIAGNOSIS — I10 Essential (primary) hypertension: Secondary | ICD-10-CM | POA: Diagnosis not present

## 2022-09-11 DIAGNOSIS — Z1211 Encounter for screening for malignant neoplasm of colon: Secondary | ICD-10-CM | POA: Diagnosis not present

## 2022-09-13 ENCOUNTER — Ambulatory Visit: Payer: Medicare HMO | Admitting: Podiatry

## 2022-09-27 ENCOUNTER — Ambulatory Visit: Payer: Medicare HMO | Admitting: Podiatry

## 2022-09-27 ENCOUNTER — Encounter: Payer: Self-pay | Admitting: Podiatry

## 2022-09-27 DIAGNOSIS — E1142 Type 2 diabetes mellitus with diabetic polyneuropathy: Secondary | ICD-10-CM

## 2022-09-27 DIAGNOSIS — Q828 Other specified congenital malformations of skin: Secondary | ICD-10-CM | POA: Diagnosis not present

## 2022-09-27 NOTE — Progress Notes (Signed)
  Subjective:  Patient ID: Lauren Bird, female    DOB: 08-10-1950,   MRN: [id number]  Chief Complaint  Patient presents with   Callouses    Bilateral callus , patient states she is a diabetic      72 y.o. female presents for concern of thickened elongated and painful nails that are difficult to trim. Requesting to have them trimmed today. Relates burning and tingling in their feet. Patient is diabetic and last A1c was 6.2   PCP:  Georgianne Fick, MD    . Denies any other pedal complaints. Denies n/v/f/c.   Past Medical History:  Diagnosis Date   Diabetes mellitus without complication (HCC)    Hyperlipidemia     Objective:  Physical Exam: Vascular: DP/PT pulses 2/4 bilateral. CFT <3 seconds. Normal hair growth on digits. No edema.  Skin. No lacerations or abrasions bilateral feet. Hyperkeratotic cored lesion noted to plantar base of fifth metatarsal bilateral with cored area.  Musculoskeletal: MMT 5/5 bilateral lower extremities in DF, PF, Inversion and Eversion. Deceased ROM in DF of ankle joint.  Neurological: Sensation intact to light touch.   Assessment:   1. Porokeratosis   2. Type 2 diabetes mellitus with diabetic polyneuropathy, without long-term current use of insulin (HCC)      Plan:  Patient was evaluated and treated and all questions answered. -Discussed and educated patient on diabetic foot care, especially with  regards to the vascular, neurological and musculoskeletal systems.  -Stressed the importance of good glycemic control and the detriment of not  controlling glucose levels in relation to the foot. -Discussed supportive shoes at all times and checking feet regularly.  -Mechanically debrided all hyperkeratotic tissue with chisel without inicdient.  -Answered all patient questions -Patient to return  in 3 months for at risk foot care -Patient advised to call the office if any problems or questions arise in the meantime.   Louann Sjogren, DPM

## 2022-10-30 DIAGNOSIS — L509 Urticaria, unspecified: Secondary | ICD-10-CM | POA: Diagnosis not present

## 2022-10-30 DIAGNOSIS — L237 Allergic contact dermatitis due to plants, except food: Secondary | ICD-10-CM | POA: Diagnosis not present

## 2022-11-02 DIAGNOSIS — R600 Localized edema: Secondary | ICD-10-CM | POA: Diagnosis not present

## 2022-11-02 DIAGNOSIS — L509 Urticaria, unspecified: Secondary | ICD-10-CM | POA: Diagnosis not present

## 2022-11-02 DIAGNOSIS — I872 Venous insufficiency (chronic) (peripheral): Secondary | ICD-10-CM | POA: Diagnosis not present

## 2022-11-02 DIAGNOSIS — L237 Allergic contact dermatitis due to plants, except food: Secondary | ICD-10-CM | POA: Diagnosis not present

## 2022-12-05 DIAGNOSIS — L578 Other skin changes due to chronic exposure to nonionizing radiation: Secondary | ICD-10-CM | POA: Diagnosis not present

## 2022-12-05 DIAGNOSIS — L820 Inflamed seborrheic keratosis: Secondary | ICD-10-CM | POA: Diagnosis not present

## 2022-12-05 DIAGNOSIS — D1801 Hemangioma of skin and subcutaneous tissue: Secondary | ICD-10-CM | POA: Diagnosis not present

## 2022-12-05 DIAGNOSIS — L821 Other seborrheic keratosis: Secondary | ICD-10-CM | POA: Diagnosis not present

## 2022-12-05 DIAGNOSIS — L570 Actinic keratosis: Secondary | ICD-10-CM | POA: Diagnosis not present

## 2022-12-27 ENCOUNTER — Encounter: Payer: Self-pay | Admitting: Podiatry

## 2022-12-27 ENCOUNTER — Ambulatory Visit: Payer: Medicare HMO | Admitting: Podiatry

## 2022-12-27 DIAGNOSIS — M79674 Pain in right toe(s): Secondary | ICD-10-CM | POA: Diagnosis not present

## 2022-12-27 DIAGNOSIS — Q828 Other specified congenital malformations of skin: Secondary | ICD-10-CM

## 2022-12-27 DIAGNOSIS — E119 Type 2 diabetes mellitus without complications: Secondary | ICD-10-CM

## 2022-12-27 DIAGNOSIS — B351 Tinea unguium: Secondary | ICD-10-CM

## 2022-12-27 DIAGNOSIS — M79675 Pain in left toe(s): Secondary | ICD-10-CM | POA: Diagnosis not present

## 2022-12-27 DIAGNOSIS — E1142 Type 2 diabetes mellitus with diabetic polyneuropathy: Secondary | ICD-10-CM | POA: Diagnosis not present

## 2022-12-27 NOTE — Progress Notes (Signed)
  Subjective:  Patient ID: Lauren Bird, female    DOB: [birth year],   MRN: [id number]  Chief Complaint  Patient presents with   Nail Problem    DFC   Callouses    B/L sides of feet    72 y.o. female presents for concern of thickened elongated and painful nails that are difficult to trim. Requesting to have them trimmed today. Relates burning and tingling in their feet. Patient is diabetic and last A1c was 6.2   PCP:  Georgianne Fick, MD    . Denies any other pedal complaints. Denies n/v/f/c.   Past Medical History:  Diagnosis Date   Diabetes mellitus without complication (HCC)    Hyperlipidemia     Objective:  Physical Exam: Vascular: DP/PT pulses 2/4 bilateral. CFT <3 seconds. Normal hair growth on digits. No edema.  Skin. No lacerations or abrasions bilateral feet. Hyperkeratotic cored lesion noted to plantar base of fifth metatarsal bilateral with cored area. Nails 1-5 bilateral are thickened elongated and dystrophic with subunugal debris.  Musculoskeletal: MMT 5/5 bilateral lower extremities in DF, PF, Inversion and Eversion. Deceased ROM in DF of ankle joint.  Neurological: Sensation intact to light touch.   Assessment:   1. Pain due to onychomycosis of toenails of both feet   2. Porokeratosis   3. Type 2 diabetes mellitus with diabetic polyneuropathy, without long-term current use of insulin (HCC)   4. Encounter for diabetic foot exam (HCC)      Plan:  Patient was evaluated and treated and all questions answered. -Discussed and educated patient on diabetic foot care, especially with  regards to the vascular, neurological and musculoskeletal systems.  -Stressed the importance of good glycemic control and the detriment of not  controlling glucose levels in relation to the foot. -Discussed supportive shoes at all times and checking feet regularly.  -Mechanically debrided all hyperkeratotic tissue with chisel without inicdient.  -Mechanically debrided nails 1-5  bilateral with nail nipper and dremel without incident.  -Answered all patient questions -Patient to return  in 3 months for at risk foot care -Patient advised to call the office if any problems or questions arise in the meantime.   Louann Sjogren, DPM

## 2023-01-16 DIAGNOSIS — Z1212 Encounter for screening for malignant neoplasm of rectum: Secondary | ICD-10-CM | POA: Diagnosis not present

## 2023-01-16 DIAGNOSIS — Z1211 Encounter for screening for malignant neoplasm of colon: Secondary | ICD-10-CM | POA: Diagnosis not present

## 2023-01-22 LAB — COLOGUARD: COLOGUARD: NEGATIVE

## 2023-01-22 LAB — EXTERNAL GENERIC LAB PROCEDURE: COLOGUARD: NEGATIVE

## 2023-03-12 DIAGNOSIS — E782 Mixed hyperlipidemia: Secondary | ICD-10-CM | POA: Diagnosis not present

## 2023-03-12 DIAGNOSIS — I10 Essential (primary) hypertension: Secondary | ICD-10-CM | POA: Diagnosis not present

## 2023-03-12 DIAGNOSIS — R5383 Other fatigue: Secondary | ICD-10-CM | POA: Diagnosis not present

## 2023-03-12 DIAGNOSIS — Z0000 Encounter for general adult medical examination without abnormal findings: Secondary | ICD-10-CM | POA: Diagnosis not present

## 2023-03-12 DIAGNOSIS — E1169 Type 2 diabetes mellitus with other specified complication: Secondary | ICD-10-CM | POA: Diagnosis not present

## 2023-03-19 DIAGNOSIS — M353 Polymyalgia rheumatica: Secondary | ICD-10-CM | POA: Diagnosis not present

## 2023-03-19 DIAGNOSIS — E1169 Type 2 diabetes mellitus with other specified complication: Secondary | ICD-10-CM | POA: Diagnosis not present

## 2023-03-19 DIAGNOSIS — Z23 Encounter for immunization: Secondary | ICD-10-CM | POA: Diagnosis not present

## 2023-03-19 DIAGNOSIS — I872 Venous insufficiency (chronic) (peripheral): Secondary | ICD-10-CM | POA: Diagnosis not present

## 2023-03-19 DIAGNOSIS — K2100 Gastro-esophageal reflux disease with esophagitis, without bleeding: Secondary | ICD-10-CM | POA: Diagnosis not present

## 2023-03-19 DIAGNOSIS — I10 Essential (primary) hypertension: Secondary | ICD-10-CM | POA: Diagnosis not present

## 2023-03-19 DIAGNOSIS — E782 Mixed hyperlipidemia: Secondary | ICD-10-CM | POA: Diagnosis not present

## 2023-03-27 DIAGNOSIS — Z1231 Encounter for screening mammogram for malignant neoplasm of breast: Secondary | ICD-10-CM | POA: Diagnosis not present

## 2023-03-28 ENCOUNTER — Ambulatory Visit (INDEPENDENT_AMBULATORY_CARE_PROVIDER_SITE_OTHER): Payer: Medicare HMO | Admitting: Podiatry

## 2023-03-28 DIAGNOSIS — Z91199 Patient's noncompliance with other medical treatment and regimen due to unspecified reason: Secondary | ICD-10-CM

## 2023-03-28 NOTE — Progress Notes (Signed)
No show

## 2023-07-11 ENCOUNTER — Ambulatory Visit: Payer: Medicare HMO | Admitting: Podiatry

## 2023-07-11 DIAGNOSIS — Q828 Other specified congenital malformations of skin: Secondary | ICD-10-CM | POA: Diagnosis not present

## 2023-07-11 DIAGNOSIS — M79674 Pain in right toe(s): Secondary | ICD-10-CM | POA: Diagnosis not present

## 2023-07-11 DIAGNOSIS — M79675 Pain in left toe(s): Secondary | ICD-10-CM

## 2023-07-11 DIAGNOSIS — B351 Tinea unguium: Secondary | ICD-10-CM

## 2023-07-11 DIAGNOSIS — E1142 Type 2 diabetes mellitus with diabetic polyneuropathy: Secondary | ICD-10-CM | POA: Diagnosis not present

## 2023-07-11 NOTE — Progress Notes (Signed)
  Subjective:  Patient ID: Lauren Bird, female    DOB: 25-Aug-1950,   MRN: [id number]  No chief complaint on file.   73 y.o. female presents for concern of thickened elongated and painful nails that are difficult to trim. Requesting to have them trimmed today. Relates burning and tingling in their feet. Patient is diabetic and last A1c was 6.2   PCP:  Georgianne Fick, MD    . Denies any other pedal complaints. Denies n/v/f/c.   Past Medical History:  Diagnosis Date   Diabetes mellitus without complication (HCC)    Hyperlipidemia     Objective:  Physical Exam: Vascular: DP/PT pulses 2/4 bilateral. CFT <3 seconds. Normal hair growth on digits. No edema.  Skin. No lacerations or abrasions bilateral feet. Hyperkeratotic cored lesion noted to plantar base of fifth metatarsal bilateral with cored area. Nails 1-5 bilateral are thickened elongated and dystrophic with subunugal debris.  Musculoskeletal: MMT 5/5 bilateral lower extremities in DF, PF, Inversion and Eversion. Deceased ROM in DF of ankle joint.  Neurological: Sensation intact to light touch.   Assessment:   1. Pain due to onychomycosis of toenails of both feet   2. Porokeratosis   3. Type 2 diabetes mellitus with diabetic polyneuropathy, without long-term current use of insulin (HCC)       Plan:  Patient was evaluated and treated and all questions answered. -Discussed and educated patient on diabetic foot care, especially with  regards to the vascular, neurological and musculoskeletal systems.  -Stressed the importance of good glycemic control and the detriment of not  controlling glucose levels in relation to the foot. -Discussed supportive shoes at all times and checking feet regularly.  -Mechanically debrided all hyperkeratotic tissue with chisel without inicdient.  -Mechanically debrided nails 1-5 bilateral with nail nipper and dremel without incident.  -Answered all patient questions -Patient to return  in 3  months for at risk foot care -Patient advised to call the office if any problems or questions arise in the meantime.   Louann Sjogren, DPM

## 2023-10-01 DIAGNOSIS — E1169 Type 2 diabetes mellitus with other specified complication: Secondary | ICD-10-CM | POA: Diagnosis not present

## 2023-10-01 DIAGNOSIS — E782 Mixed hyperlipidemia: Secondary | ICD-10-CM | POA: Diagnosis not present

## 2023-10-04 DIAGNOSIS — Z0000 Encounter for general adult medical examination without abnormal findings: Secondary | ICD-10-CM | POA: Diagnosis not present

## 2023-10-04 DIAGNOSIS — E782 Mixed hyperlipidemia: Secondary | ICD-10-CM | POA: Diagnosis not present

## 2023-10-04 DIAGNOSIS — M353 Polymyalgia rheumatica: Secondary | ICD-10-CM | POA: Diagnosis not present

## 2023-10-04 DIAGNOSIS — I10 Essential (primary) hypertension: Secondary | ICD-10-CM | POA: Diagnosis not present

## 2023-10-04 DIAGNOSIS — K2100 Gastro-esophageal reflux disease with esophagitis, without bleeding: Secondary | ICD-10-CM | POA: Diagnosis not present

## 2023-10-04 DIAGNOSIS — I872 Venous insufficiency (chronic) (peripheral): Secondary | ICD-10-CM | POA: Diagnosis not present

## 2023-10-04 DIAGNOSIS — E1169 Type 2 diabetes mellitus with other specified complication: Secondary | ICD-10-CM | POA: Diagnosis not present

## 2023-10-17 ENCOUNTER — Ambulatory Visit: Admitting: Podiatry

## 2023-10-17 ENCOUNTER — Encounter: Payer: Self-pay | Admitting: Podiatry

## 2023-10-17 DIAGNOSIS — E1142 Type 2 diabetes mellitus with diabetic polyneuropathy: Secondary | ICD-10-CM | POA: Diagnosis not present

## 2023-10-17 DIAGNOSIS — B351 Tinea unguium: Secondary | ICD-10-CM

## 2023-10-17 DIAGNOSIS — M79674 Pain in right toe(s): Secondary | ICD-10-CM

## 2023-10-17 DIAGNOSIS — M79675 Pain in left toe(s): Secondary | ICD-10-CM

## 2023-10-17 DIAGNOSIS — Q828 Other specified congenital malformations of skin: Secondary | ICD-10-CM

## 2023-10-17 NOTE — Progress Notes (Signed)
  Subjective:  Patient ID: Lauren Bird, female    DOB: 1951-03-28,   MRN: [id number]  Chief Complaint  Patient presents with   [facility]    Rm22/ Diabetic/ Dr. Corlis last visit June 2025    72 y.o. female presents for concern of thickened elongated and painful nails that are difficult to trim. Requesting to have them trimmed today. Relates burning and tingling in their feet. Patient is diabetic and last A1c was 6.2   PCP:  Lauren Pagan, NP    . Denies any other pedal complaints. Denies n/v/f/c.   Past Medical History:  Diagnosis Date   Diabetes mellitus without complication (HCC)    Hyperlipidemia     Objective:  Physical Exam: Vascular: DP/PT pulses 2/4 bilateral. CFT <3 seconds. Normal hair growth on digits. No edema.  Skin. No lacerations or abrasions bilateral feet. Hyperkeratotic cored lesion noted to plantar base of fifth metatarsal bilateral with cored area. Nails 1-5 bilateral are thickened elongated and dystrophic with subunugal debris.  Musculoskeletal: MMT 5/5 bilateral lower extremities in DF, PF, Inversion and Eversion. Deceased ROM in DF of ankle joint.  Neurological: Sensation intact to light touch.   Assessment:   1. Pain due to onychomycosis of toenails of both feet   2. Type 2 diabetes mellitus with diabetic polyneuropathy, without long-term current use of insulin (HCC)       Plan:  Patient was evaluated and treated and all questions answered. -Discussed and educated patient on diabetic foot care, especially with  regards to the vascular, neurological and musculoskeletal systems.  -Stressed the importance of good glycemic control and the detriment of not  controlling glucose levels in relation to the foot. -Discussed supportive shoes at all times and checking feet regularly.  -Mechanically debrided all hyperkeratotic tissue with chisel without inicdient.  -Mechanically debrided nails 1-5 bilateral with nail nipper and dremel without incident.  -Answered all  patient questions -Patient to return  in 3 months for at risk foot care -Patient advised to call the office if any problems or questions arise in the meantime.   Lauren Bird, DPM

## 2024-02-13 ENCOUNTER — Ambulatory Visit (INDEPENDENT_AMBULATORY_CARE_PROVIDER_SITE_OTHER): Payer: Self-pay | Admitting: Podiatry

## 2024-02-13 ENCOUNTER — Ambulatory Visit: Admitting: Podiatry

## 2024-02-13 DIAGNOSIS — E1142 Type 2 diabetes mellitus with diabetic polyneuropathy: Secondary | ICD-10-CM | POA: Diagnosis not present

## 2024-02-13 DIAGNOSIS — M79675 Pain in left toe(s): Secondary | ICD-10-CM | POA: Diagnosis not present

## 2024-02-13 DIAGNOSIS — Z91199 Patient's noncompliance with other medical treatment and regimen due to unspecified reason: Secondary | ICD-10-CM

## 2024-02-13 DIAGNOSIS — M79674 Pain in right toe(s): Secondary | ICD-10-CM | POA: Diagnosis not present

## 2024-02-13 DIAGNOSIS — B351 Tinea unguium: Secondary | ICD-10-CM

## 2024-02-13 NOTE — Progress Notes (Signed)
 No show

## 2024-02-13 NOTE — Progress Notes (Signed)
  Subjective:  Patient ID: Lauren Bird, female    DOB: 1951-03-05,   MRN: [id number]  Chief Complaint  Patient presents with   Nail Problem    Nail trim     73 y.o. female presents for concern of thickened elongated and painful nails that are difficult to trim. Requesting to have them trimmed today. Relates burning and tingling in their feet. Patient is diabetic and last A1c was 6.2   PCP:  Corlis Pagan, NP    . Denies any other pedal complaints. Denies n/v/f/c.   Past Medical History:  Diagnosis Date   Diabetes mellitus without complication (HCC)    Hyperlipidemia     Objective:  Physical Exam: Vascular: DP/PT pulses 2/4 bilateral. CFT <3 seconds. Normal hair growth on digits. No edema.  Skin. No lacerations or abrasions bilateral feet. Hyperkeratotic cored lesion noted to plantar base of fifth metatarsal bilateral with cored area. Nails 1-5 bilateral are thickened elongated and dystrophic with subunugal debris.  Musculoskeletal: MMT 5/5 bilateral lower extremities in DF, PF, Inversion and Eversion. Deceased ROM in DF of ankle joint.  Neurological: Sensation intact to light touch.   Assessment:   No diagnosis found.     Plan:  Patient was evaluated and treated and all questions answered. -Discussed and educated patient on diabetic foot care, especially with  regards to the vascular, neurological and musculoskeletal systems.  -Discussed supportive shoes at all times and checking feet regularly.  -Mechanically debrided all hyperkeratotic tissue with chisel without inicdient.  -Mechanically debrided nails 1-5 bilateral with nail nipper and dremel without incident.  -Answered all patient questions -Patient to return  in 3 months for at risk foot care -Patient advised to call the office if any problems or questions arise in the meantime.  Franky Blanch D.P.M.

## 2024-02-26 DIAGNOSIS — Z85828 Personal history of other malignant neoplasm of skin: Secondary | ICD-10-CM | POA: Diagnosis not present

## 2024-02-26 DIAGNOSIS — L570 Actinic keratosis: Secondary | ICD-10-CM | POA: Diagnosis not present

## 2024-02-26 DIAGNOSIS — L814 Other melanin hyperpigmentation: Secondary | ICD-10-CM | POA: Diagnosis not present

## 2024-02-26 DIAGNOSIS — D485 Neoplasm of uncertain behavior of skin: Secondary | ICD-10-CM | POA: Diagnosis not present

## 2024-02-26 DIAGNOSIS — L821 Other seborrheic keratosis: Secondary | ICD-10-CM | POA: Diagnosis not present

## 2024-02-26 DIAGNOSIS — C44311 Basal cell carcinoma of skin of nose: Secondary | ICD-10-CM | POA: Diagnosis not present

## 2024-02-26 DIAGNOSIS — L820 Inflamed seborrheic keratosis: Secondary | ICD-10-CM | POA: Diagnosis not present

## 2024-02-26 DIAGNOSIS — C44729 Squamous cell carcinoma of skin of left lower limb, including hip: Secondary | ICD-10-CM | POA: Diagnosis not present

## 2024-04-01 DIAGNOSIS — Z1231 Encounter for screening mammogram for malignant neoplasm of breast: Secondary | ICD-10-CM | POA: Diagnosis not present

## 2024-04-02 DIAGNOSIS — E1169 Type 2 diabetes mellitus with other specified complication: Secondary | ICD-10-CM | POA: Diagnosis not present

## 2024-04-02 DIAGNOSIS — E782 Mixed hyperlipidemia: Secondary | ICD-10-CM | POA: Diagnosis not present

## 2024-04-07 DIAGNOSIS — I872 Venous insufficiency (chronic) (peripheral): Secondary | ICD-10-CM | POA: Diagnosis not present

## 2024-04-07 DIAGNOSIS — E782 Mixed hyperlipidemia: Secondary | ICD-10-CM | POA: Diagnosis not present

## 2024-04-07 DIAGNOSIS — I10 Essential (primary) hypertension: Secondary | ICD-10-CM | POA: Diagnosis not present

## 2024-04-07 DIAGNOSIS — E1169 Type 2 diabetes mellitus with other specified complication: Secondary | ICD-10-CM | POA: Diagnosis not present

## 2024-04-07 DIAGNOSIS — K2100 Gastro-esophageal reflux disease with esophagitis, without bleeding: Secondary | ICD-10-CM | POA: Diagnosis not present

## 2024-04-07 DIAGNOSIS — Z23 Encounter for immunization: Secondary | ICD-10-CM | POA: Diagnosis not present

## 2024-04-07 DIAGNOSIS — M064 Inflammatory polyarthropathy: Secondary | ICD-10-CM | POA: Diagnosis not present

## 2024-05-14 ENCOUNTER — Ambulatory Visit (INDEPENDENT_AMBULATORY_CARE_PROVIDER_SITE_OTHER): Payer: Self-pay | Admitting: Podiatry

## 2024-05-14 DIAGNOSIS — Z91199 Patient's noncompliance with other medical treatment and regimen due to unspecified reason: Secondary | ICD-10-CM

## 2024-05-14 NOTE — Progress Notes (Signed)
 No show

## 2024-05-19 ENCOUNTER — Ambulatory Visit: Admitting: Podiatry

## 2024-05-20 ENCOUNTER — Ambulatory Visit: Admitting: Podiatry

## 2024-05-27 ENCOUNTER — Ambulatory Visit: Admitting: Podiatry

## 2024-05-27 ENCOUNTER — Encounter: Payer: Self-pay | Admitting: Podiatry

## 2024-05-27 DIAGNOSIS — M79674 Pain in right toe(s): Secondary | ICD-10-CM | POA: Diagnosis not present

## 2024-05-27 DIAGNOSIS — B351 Tinea unguium: Secondary | ICD-10-CM

## 2024-05-27 DIAGNOSIS — M79675 Pain in left toe(s): Secondary | ICD-10-CM | POA: Diagnosis not present

## 2024-08-27 ENCOUNTER — Ambulatory Visit: Admitting: Podiatry
